# Patient Record
Sex: Male | Born: 1950 | Race: White | Hispanic: No | Marital: Married | Smoking: Former smoker
Health system: Southern US, Community
[De-identification: ages and names within clinical notes are randomized; demographics above are authoritative.]

## PROBLEM LIST (undated history)

## (undated) HISTORY — PX: SHOULDER SURGERY: SHX246

---

## 2009-04-23 ENCOUNTER — Emergency Department: Payer: Self-pay | Admitting: Emergency Medicine

## 2010-05-14 IMAGING — CR DG SHOULDER 3+V*L*
1 series · 3 of 3 positions shown · non-contrast
Comparison: none

REASON FOR EXAM: fall, shoulder pain
COMMENTS:

PROCEDURE:     DXR - DXR SHOULDER LEFT COMPLETE  - April 23, 2009 [DATE]
RESULT:     Three views of the left shoulder reveal the bones to be
reasonably well mineralized. I do not see evidence of acute fracture nor
dislocation. The AC joint is grossly intact.

[Series 1: view not recorded · 0.17mm/px · 3 of 3 slices shown]
[im 1/3]
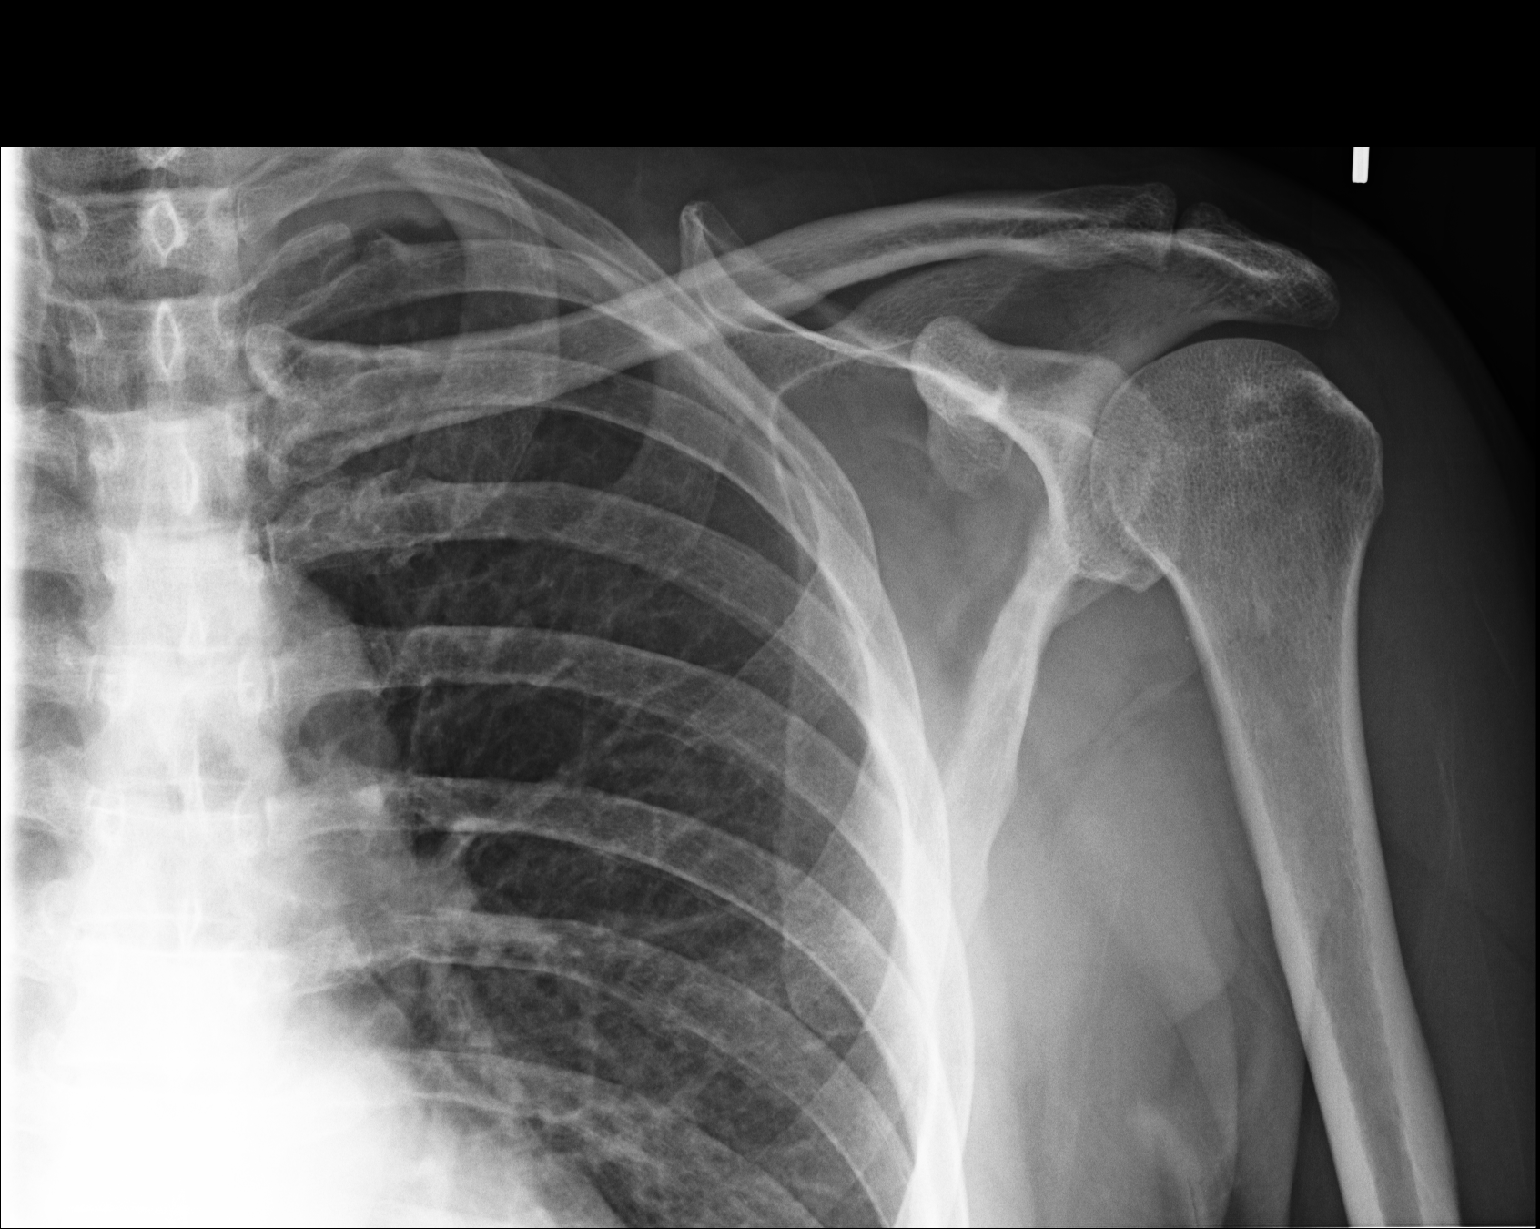
[im 2/3]
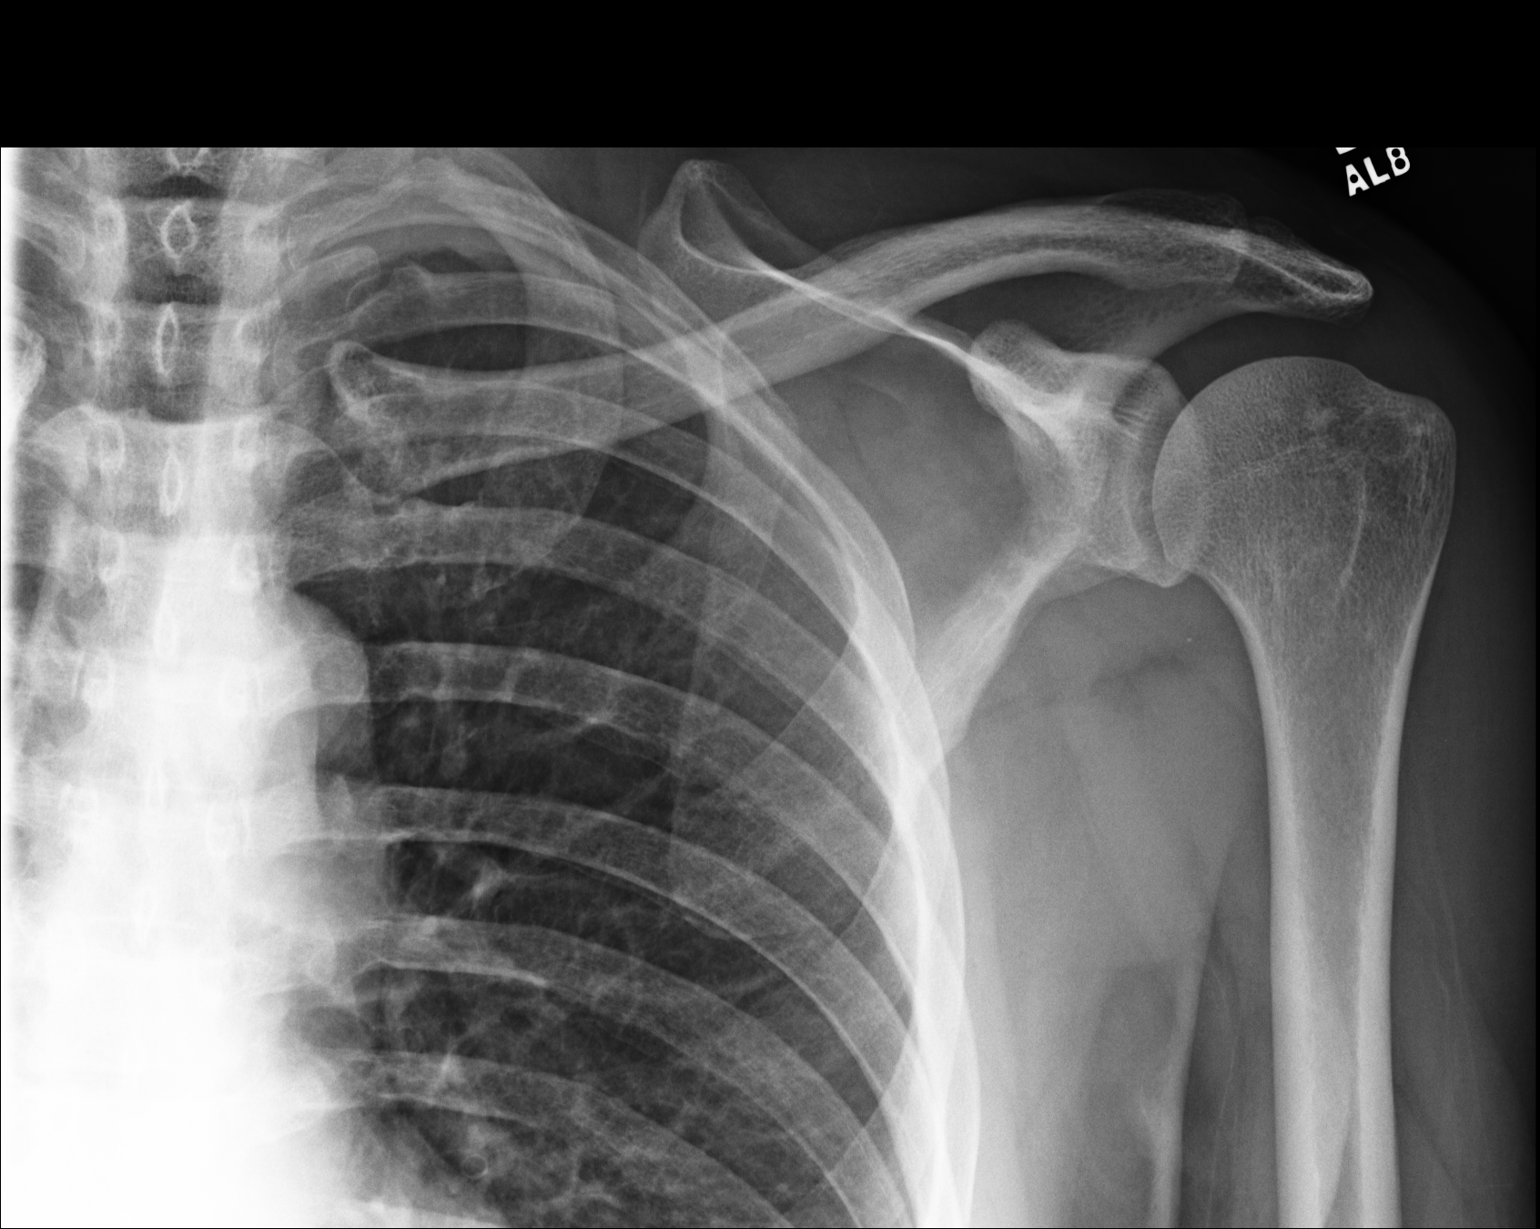
[im 3/3]
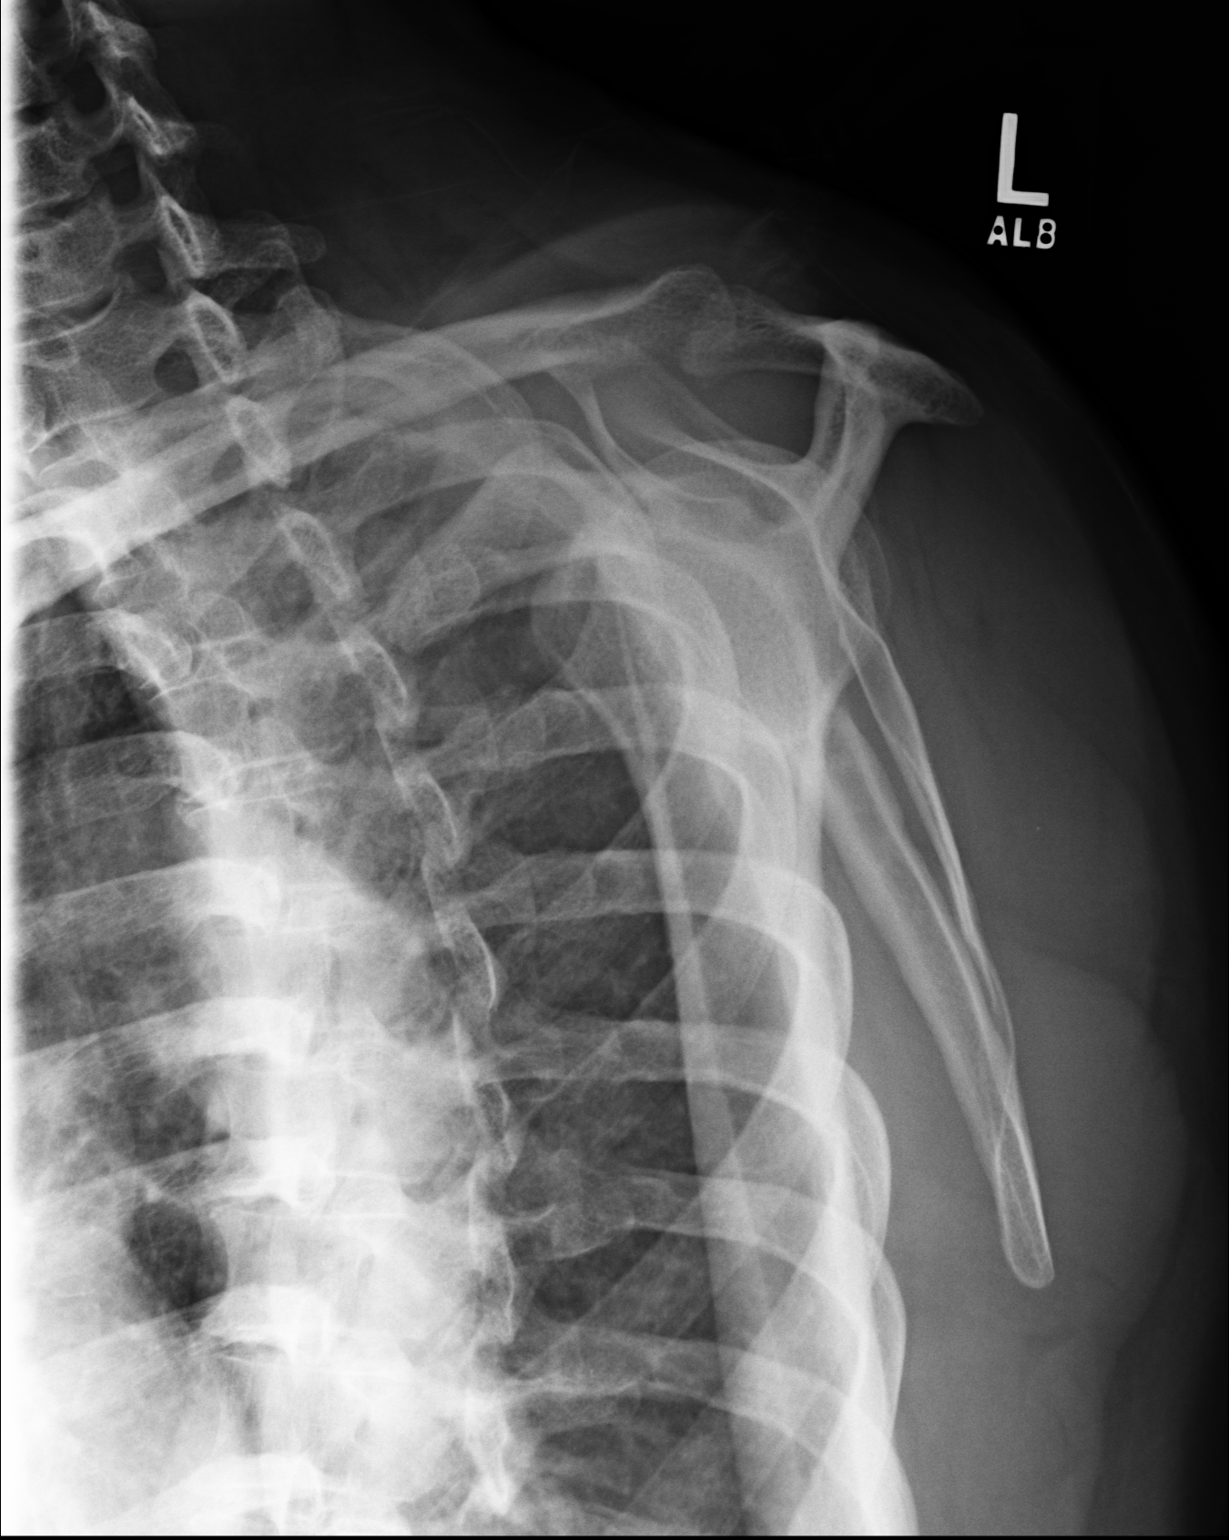

[3 of 3 positions shown; findings below may reference images not displayed]

IMPRESSION: I see no acute bony abnormality of the left shoulder.

## 2011-10-12 DIAGNOSIS — I10 Essential (primary) hypertension: Secondary | ICD-10-CM | POA: Insufficient documentation

## 2011-10-12 DIAGNOSIS — K219 Gastro-esophageal reflux disease without esophagitis: Secondary | ICD-10-CM | POA: Insufficient documentation

## 2015-05-12 DIAGNOSIS — G8929 Other chronic pain: Secondary | ICD-10-CM | POA: Insufficient documentation

## 2015-05-12 DIAGNOSIS — M19011 Primary osteoarthritis, right shoulder: Secondary | ICD-10-CM | POA: Insufficient documentation

## 2015-05-12 DIAGNOSIS — M5030 Other cervical disc degeneration, unspecified cervical region: Secondary | ICD-10-CM | POA: Insufficient documentation

## 2018-04-27 DIAGNOSIS — G5600 Carpal tunnel syndrome, unspecified upper limb: Secondary | ICD-10-CM | POA: Insufficient documentation

## 2020-07-27 NOTE — Progress Notes (Signed)
[facility] [address] [place], [place] [address]  Pulmonary Sleep Medicine   Office Visit Note  Patient Name: Travis Frank DOB: 01-31-1951 MRN [id number]    Chief Complaint: Obstructive Sleep Apnea visit  Brief History:  Travis Frank is seen today for follow up  The patient has a 5 year history of sleep apnea. He was to have a replacement of his recalled ASV. Patient stopped using his CPAP in December because his machine was putting out black particles.  Prior to this he was using PAP nightly.  The patient felt more rested after sleeping with PAP.  The patient reports benefiting from PAP use before his machine began breaking down.  His sleep is more disturbed without the CPAP  the Epworth Sleepiness Score is 5 out of 24.  The old compliance download shows excellent compliance with an average use time of 6 hours. The AHI is 2.8  The patient does not complain of limb movements disrupting sleep.  ROS  General: (-) fever, (-) chills, (-) night sweat Nose and Sinuses: (-) nasal stuffiness or itchiness, (-) postnasal drip, (-) nosebleeds, (-) sinus trouble. Mouth and Throat: (-) sore throat, (-) hoarseness. Neck: (-) swollen glands, (-) enlarged thyroid, (-) neck pain. Respiratory: - cough, - shortness of breath, - wheezing. Neurologic: - numbness, - tingling. Psychiatric: - anxiety, - depression   Current Medication: Outpatient Encounter Medications as of 07/30/2020  Medication Sig  . enalapril (VASOTEC) 10 MG tablet 5 mg.  . pantoprazole (PROTONIX) 40 MG tablet pantoprazole 40 mg tablet,delayed release  . aspirin EC 81 MG tablet Take by mouth.   No facility-administered encounter medications on file as of 07/30/2020.    Surgical History: Past Surgical History:  Procedure Laterality Date  . SHOULDER SURGERY      Medical History: History reviewed. No pertinent past medical history.  Family History: Non contributory to the present illness  Social  History: Social History   Socioeconomic History  . Marital status: Married    Spouse name: Not on file  . Number of children: Not on file  . Years of education: Not on file  . Highest education level: Not on file  Occupational History  . Not on file  Tobacco Use  . Smoking status: Former Games developer  . Smokeless tobacco: Never Used  Substance and Sexual Activity  . Alcohol use: Not on file  . Drug use: Not on file  . Sexual activity: Not on file  Other Topics Concern  . Not on file  Social History Narrative  . Not on file   Social Determinants of Health   Financial Resource Strain: Not on file  Food Insecurity: Not on file  Transportation Needs: Not on file  Physical Activity: Not on file  Stress: Not on file  Social Connections: Not on file  Intimate Partner Violence: Not on file    Vital Signs: Blood pressure 123/69, pulse 69, temperature 98.6 F (37 C), temperature source Temporal, resp. rate 16, height 5\' 7"  (1.702 m), weight 178 lb (80.7 kg), SpO2 97 %.  Examination: General Appearance: The patient is well-developed, well-nourished, and in no distress. Neck Circumference: 40 Skin: Gross inspection of skin unremarkable. Head: normocephalic, no gross deformities. Eyes: no gross deformities noted. ENT: ears appear grossly normal Neurologic: Alert and oriented. No involuntary movements.    EPWORTH SLEEPINESS SCALE:  Scale:  (0)= no chance of dozing; (1)= slight chance of dozing; (2)= moderate chance of dozing; (3)= high chance of dozing  Chance  Situtation    Sitting and reading: 1    Watching TV: 1    Sitting Inactive in public: 0    As a passenger in car: 1      Lying down to rest: 1    Sitting and talking: 0    Sitting quielty after lunch: 1    In a car, stopped in traffic: 0   TOTAL SCORE:   5 out of 24    SLEEP STUDIES:  1. PSG 11/30/15 - AHI 64.9, Low SpO2 58%   CPAP COMPLIANCE DATA:  Patient no current usage on recalled ASV.  Date  Range: 01/15/20 - 04/13/20  Average Daily Use: 6:01 hours  Median Use: 6:01 hours  Compliance for > 4 Hours: 100%  AHI: 2.8 respiratory events per hour  Days Used: 90/90 days  Mask Leak: large leak 5.1% nightly         LABS: No results found for this or any previous visit (from the past 2160 hour(s)).  Radiology: CT CERVICAL SPINE W CONTRAST  [facility] HX)  Result Date: 04/23/2009 * PRIOR REPORT IMPORTED FROM AN EXTERNAL SYSTEM * PRIOR REPORT IMPORTED FROM THE SYNGO WORKFLOW SYSTEM REASON FOR EXAM:    fall, upper neck pain COMMENTS: PROCEDURE:     CT  - CT CERVICAL SPINE WO  - Apr 23 2009 12:05PM RESULT:     Multislice helical acquisition through the cervical spine is reconstructed at bone window settings in the axial, coronal and sagittal planes. There is no previous exam for comparison. The craniocervical and atlantoaxial alignment appear to be maintained. The odontoid is intact. The prevertebral soft tissues appear normal. Degenerative disc space narrowing and hypertrophic spurring is present at C5-C6 and C6-C7 and to a lesser extent C4-C5. There is no subluxation. IMPRESSION:      Degenerative changes. No acute bony abnormality demonstrated.    CT MAXILLOFACIAL WO CONTRAST ([facility] HX)  Result Date: 04/23/2009 * PRIOR REPORT IMPORTED FROM AN EXTERNAL SYSTEM * PRIOR REPORT IMPORTED FROM THE SYNGO WORKFLOW SYSTEM REASON FOR EXAM:    fall, left sided facial pain COMMENTS: PROCEDURE:     CT  - CT MAXILLOFACIAL AREA WO  - Apr 23 2009 12:04PM RESULT:     Multislice helical acquisition through the maxillofacial structures is reconstructed in the axial and coronal planes at 3 mm slice thickness at bone window settings. There is no previous exam for comparison. The nasal septum shows a curvature to the left without a definite fracture. A nondisplaced left nasal bone fracture cannot be completely excluded but this could represent a prominent suture. The mandible appears intact. The condyles are  located in the temporomandibular fossa. The zygomatic arches, pterygoid plates and visualized portions of the temporal bone appear intact. The orbits showed no fracture of the wall. The sinuses show grossly normal aeration. IMPRESSION: 1. The possibility of a nondisplaced left nasal bone fractures not excluded. Otherwise, the study is unremarkable.    DG Shoulder Left  Result Date: 04/23/2009 * PRIOR REPORT IMPORTED FROM AN EXTERNAL SYSTEM * PRIOR REPORT IMPORTED FROM THE SYNGO WORKFLOW SYSTEM REASON FOR EXAM:    fall, shoulder pain COMMENTS: PROCEDURE:     DXR - DXR SHOULDER LEFT COMPLETE  - Apr 23 2009 12:40PM RESULT:     Three views of the left shoulder reveal the bones to be reasonably well mineralized. I do not see evidence of acute fracture nor dislocation. The [facility] joint is grossly intact. IMPRESSION:      I see  no acute bony abnormality of the left shoulder.    CT HEAD LIMITED W/O CM  Result Date: 04/23/2009 * PRIOR REPORT IMPORTED FROM AN EXTERNAL SYSTEM * PRIOR REPORT IMPORTED FROM THE SYNGO WORKFLOW SYSTEM REASON FOR EXAM:    fall, head injury, +LOC COMMENTS: PROCEDURE:     CT  - CT HEAD WITHOUT CONTRAST  - Apr 23 2009 12:04PM RESULT:     Noncontrast emergent CT of the brain is performed. The patient has no previous examination for comparison. The ventricles and sulci are normal. There is no hemorrhage. There is no focal mass, mass-effect or midline shift. There is no evidence of edema or territorial infarct. The bone windows demonstrate normal aeration of the paranasal sinuses and mastoid air cells. There is no skull fracture demonstrated. IMPRESSION: 1. No acute intracranial abnormality.     No results found.  No results found.    Assessment and Plan: Patient Active Problem List   Diagnosis Date Noted  . OSA on CPAP 07/30/2020  . CPAP use counseling 07/30/2020  . Essential hypertension 10/12/2011  . Esophageal reflux 10/12/2011    1. OSA on CPAP The patient does tolerate PAP  and reports significant benefit from PAP use. He has been unable  To use it because of black particles emitted.  The compliance has been excellent and the apnea is controlled OSA- continue excellent compliance    2. CPAP use counseling CPAP Counseling: had a lengthy discussion with the patient regarding the importance of PAP therapy in management of the sleep apnea. Patient appears to understand the risk factor reduction and also understands the risks associated with untreated sleep apnea. Patient will try to make a good faith effort to remain compliant with therapy. Also instructed the patient on proper cleaning of the device including the water must be changed daily if possible and use of distilled water is preferred. Patient understands that the machine should be regularly cleaned with appropriate recommended cleaning solutions that do not damage the PAP machine for example given white vinegar and water rinses. Other methods such as ozone treatment may not be as good as these simple methods to achieve cleaning.  3. Gastroesophageal reflux disease without esophagitis Asymptomatic on pantoprazole, continue.   General Counseling: I have discussed the findings of the evaluation and examination with Jeannett Senior.  I have also discussed any further diagnostic evaluation thatmay be needed or ordered today. Eli verbalizes understanding of the findings of todays visit. We also reviewed his medications today and discussed drug interactions and side effects including but not limited excessive drowsiness and altered mental states. We also discussed that there is always a risk not just to him but also people around him. he has been encouraged to call the office with any questions or concerns that should arise related to todays visit.  No orders of the defined types were placed in this encounter.       I have personally obtained a history, examined the patient, evaluated laboratory and imaging results,  formulated the assessment and plan and placed orders.  This patient was seen today by Emmaline Kluver, PA-C in collaboration with Dr. Freda Munro.   Valentino Hue Sol Blazing, PhD, FAASM  Diplomate, American Board of Sleep Medicine    Yevonne Pax, MD [facility] Diplomate ABMS Pulmonary and Critical Care Medicine Sleep medicine

## 2020-07-30 ENCOUNTER — Ambulatory Visit (INDEPENDENT_AMBULATORY_CARE_PROVIDER_SITE_OTHER): Payer: Medicare Other | Admitting: Internal Medicine

## 2020-07-30 VITALS — BP 123/69 | HR 69 | Temp 98.6°F | Resp 16 | Ht 67.0 in | Wt 178.0 lb

## 2020-07-30 DIAGNOSIS — Z7189 Other specified counseling: Secondary | ICD-10-CM | POA: Diagnosis not present

## 2020-07-30 DIAGNOSIS — K219 Gastro-esophageal reflux disease without esophagitis: Secondary | ICD-10-CM

## 2020-07-30 DIAGNOSIS — G4733 Obstructive sleep apnea (adult) (pediatric): Secondary | ICD-10-CM

## 2020-07-30 DIAGNOSIS — Z9989 Dependence on other enabling machines and devices: Secondary | ICD-10-CM | POA: Diagnosis not present

## 2020-07-30 DIAGNOSIS — G4731 Primary central sleep apnea: Secondary | ICD-10-CM | POA: Insufficient documentation

## 2020-07-30 NOTE — Patient Instructions (Signed)

## 2020-12-24 DIAGNOSIS — E782 Mixed hyperlipidemia: Secondary | ICD-10-CM | POA: Insufficient documentation

## 2021-04-08 ENCOUNTER — Ambulatory Visit (INDEPENDENT_AMBULATORY_CARE_PROVIDER_SITE_OTHER): Payer: Medicare Other | Admitting: Internal Medicine

## 2021-04-08 VITALS — BP 150/75 | HR 75 | Ht 67.0 in | Wt 177.0 lb

## 2021-04-08 DIAGNOSIS — G4733 Obstructive sleep apnea (adult) (pediatric): Secondary | ICD-10-CM | POA: Diagnosis not present

## 2021-04-08 DIAGNOSIS — I10 Essential (primary) hypertension: Secondary | ICD-10-CM | POA: Diagnosis not present

## 2021-04-08 NOTE — Patient Instructions (Signed)

## 2021-04-08 NOTE — Progress Notes (Signed)
[facility] [address] [place], [place] [address]  Pulmonary Sleep Medicine   Office Visit Note  Patient Name: Travis Frank DOB: [birth year] MRN [id number]    Chief Complaint: Obstructive Sleep Apnea visit  Brief History:  Travis Frank is seen today for post setup follow up. The patient has a 6 year history of sleep apnea. Patient is using PAP nightly.  The patient feels BETTER after sleeping with PAP.  The patient reports BENEFITING from PAP use.  Epworth Sleepiness Score is 9 out of 24. The patient does take 2 hr  naps on weekend on cpaP. The patient complains of the following: NOTHING  The compliance download shows  compliance with an average use time of 5:47 hours @ 90%. The AHI is 0.7  The patient does not complain of limb movements disrupting sleep.  ROS  General: (-) fever, (-) chills, (-) night sweat Nose and Sinuses: (-) nasal stuffiness or itchiness, (-) postnasal drip, (-) nosebleeds, (-) sinus trouble. Mouth and Throat: (-) sore throat, (-) hoarseness. Neck: (-) swollen glands, (-) enlarged thyroid, (-) neck pain. Respiratory: - cough, - shortness of breath, - wheezing. Neurologic: - numbness, - tingling. Psychiatric: - anxiety, - depression   Current Medication: Outpatient Encounter Medications as of 04/08/2021  Medication Sig   enalapril (VASOTEC) 5 MG tablet Take by mouth.   pantoprazole (PROTONIX) 40 MG tablet Take by mouth.   aspirin EC 81 MG tablet Take by mouth.   enalapril (VASOTEC) 10 MG tablet 5 mg.   Multiple Vitamin (MULTIVITAMIN) capsule Take 1 capsule by mouth daily.   pantoprazole (PROTONIX) 40 MG tablet pantoprazole 40 mg tablet,delayed release   No facility-administered encounter medications on file as of 04/08/2021.    Surgical History: Past Surgical History:  Procedure Laterality Date   SHOULDER SURGERY      Medical History: History reviewed. No pertinent past medical history.  Family History: Non contributory to the  present illness  Social History: Social History   Socioeconomic History   Marital status: Married    Spouse name: Not on file   Number of children: Not on file   Years of education: Not on file   Highest education level: Not on file  Occupational History   Not on file  Tobacco Use   Smoking status: Former   Smokeless tobacco: Never  Substance and Sexual Activity   Alcohol use: Not on file   Drug use: Not on file   Sexual activity: Not on file  Other Topics Concern   Not on file  Social History Narrative   Not on file   Social Determinants of Health   Financial Resource Strain: Not on file  Food Insecurity: Not on file  Transportation Needs: Not on file  Physical Activity: Not on file  Stress: Not on file  Social Connections: Not on file  Intimate Partner Violence: Not on file    Vital Signs: Blood pressure (!) 150/75, pulse 75, height 5\' 7"  (1.702 m), weight 177 lb (80.3 kg), SpO2 95 %. Body mass index is 27.72 kg/m.    Examination: General Appearance: The patient is well-developed, well-nourished, and in no distress. Neck Circumference: 41 cm Skin: Gross inspection of skin unremarkable. Head: normocephalic, no gross deformities. Eyes: no gross deformities noted. ENT: ears appear grossly normal Neurologic: Alert and oriented. No involuntary movements.    EPWORTH SLEEPINESS SCALE:  Scale:  (0)= no chance of dozing; (1)= slight chance of dozing; (2)= moderate chance of dozing; (3)= high chance of  dozing  Chance  Situtation    Sitting and reading: 1    Watching TV: 1    Sitting Inactive in public: 1    As a passenger in car: 1      Lying down to rest: 2    Sitting and talking: 1    Sitting quielty after lunch: 2    In a car, stopped in traffic: 0   TOTAL SCORE:   9 out of 24    SLEEP STUDIES:  PSG 11/30/15 - AHI 64.9, Low SpO2 58%   CPAP COMPLIANCE DATA:  Date Range: 03/09/21 - 04/18/21  Average Daily Use: 5:47 hours  Median Use:  5:53 hours  Compliance for > 4 Hours: 90%  AHI: 0.7 respiratory events per hour  Days Used: 27/30  Mask Leak: 10.0 lpm  95th Percentile Pressure: ASV @ min/max EPAP-5/15,  min/max PS 3/8 cmH2O         LABS: No results found for this or any previous visit (from the past 2160 hour(s)).  Radiology: CT CERVICAL SPINE W CONTRAST  [facility] HX)  Result Date: 04/23/2009 * PRIOR REPORT IMPORTED FROM AN EXTERNAL SYSTEM * PRIOR REPORT IMPORTED FROM THE SYNGO WORKFLOW SYSTEM REASON FOR EXAM:    fall, upper neck pain COMMENTS: PROCEDURE:     CT  - CT CERVICAL SPINE WO  - Apr 23 2009 12:05PM RESULT:     Multislice helical acquisition through the cervical spine is reconstructed at bone window settings in the axial, coronal and sagittal planes. There is no previous exam for comparison. The craniocervical and atlantoaxial alignment appear to be maintained. The odontoid is intact. The prevertebral soft tissues appear normal. Degenerative disc space narrowing and hypertrophic spurring is present at C5-C6 and C6-C7 and to a lesser extent C4-C5. There is no subluxation. IMPRESSION:      Degenerative changes. No acute bony abnormality demonstrated.    CT MAXILLOFACIAL WO CONTRAST ([facility] HX)  Result Date: 04/23/2009 * PRIOR REPORT IMPORTED FROM AN EXTERNAL SYSTEM * PRIOR REPORT IMPORTED FROM THE SYNGO WORKFLOW SYSTEM REASON FOR EXAM:    fall, left sided facial pain COMMENTS: PROCEDURE:     CT  - CT MAXILLOFACIAL AREA WO  - Apr 23 2009 12:04PM RESULT:     Multislice helical acquisition through the maxillofacial structures is reconstructed in the axial and coronal planes at 3 mm slice thickness at bone window settings. There is no previous exam for comparison. The nasal septum shows a curvature to the left without a definite fracture. A nondisplaced left nasal bone fracture cannot be completely excluded but this could represent a prominent suture. The mandible appears intact. The condyles are located in the  temporomandibular fossa. The zygomatic arches, pterygoid plates and visualized portions of the temporal bone appear intact. The orbits showed no fracture of the wall. The sinuses show grossly normal aeration. IMPRESSION: 1. The possibility of a nondisplaced left nasal bone fractures not excluded. Otherwise, the study is unremarkable.    DG Shoulder Left  Result Date: 04/23/2009 * PRIOR REPORT IMPORTED FROM AN EXTERNAL SYSTEM * PRIOR REPORT IMPORTED FROM THE SYNGO WORKFLOW SYSTEM REASON FOR EXAM:    fall, shoulder pain COMMENTS: PROCEDURE:     DXR - DXR SHOULDER LEFT COMPLETE  - Apr 23 2009 12:40PM RESULT:     Three views of the left shoulder reveal the bones to be reasonably well mineralized. I do not see evidence of acute fracture nor dislocation. The [facility] joint is grossly intact. IMPRESSION:  I see no acute bony abnormality of the left shoulder.    CT HEAD LIMITED W/O CM  Result Date: 04/23/2009 * PRIOR REPORT IMPORTED FROM AN EXTERNAL SYSTEM * PRIOR REPORT IMPORTED FROM THE SYNGO WORKFLOW SYSTEM REASON FOR EXAM:    fall, head injury, +LOC COMMENTS: PROCEDURE:     CT  - CT HEAD WITHOUT CONTRAST  - Apr 23 2009 12:04PM RESULT:     Noncontrast emergent CT of the brain is performed. The patient has no previous examination for comparison. The ventricles and sulci are normal. There is no hemorrhage. There is no focal mass, mass-effect or midline shift. There is no evidence of edema or territorial infarct. The bone windows demonstrate normal aeration of the paranasal sinuses and mastoid air cells. There is no skull fracture demonstrated. IMPRESSION: 1. No acute intracranial abnormality.     No results found.  No results found.    Assessment and Plan: Patient Active Problem List   Diagnosis Date Noted   OSA on CPAP 07/30/2020   CPAP use counseling 07/30/2020   Essential hypertension 10/12/2011   Esophageal reflux 10/12/2011   1. OSA treated with BiPAP The patient does tolerate PAP and reports   benefit from PAP use. The patient was reminded how to clean equipment and advised to replace supplies routinely. The patient was also counselled on weight loss. He told me he wanted to have the [facility] surgical procedure. We did discuss his central apneas and I did caution him the Earnest Bailey would not address that.. The compliance is very good. The AHI is 0.7.   OSA treated with bipap. Well controlled. Continue with very good compliance with bipap.   2. Essential hypertension Hypertension Counseling:   The following hypertensive lifestyle modification were recommended and discussed:  1. Limiting alcohol intake to less than 1 oz/day of ethanol:(24 oz of beer or 8 oz of wine or 2 oz of 100-proof whiskey). 2. Take baby ASA 81 mg daily. 3. Importance of regular aerobic exercise and losing weight. 4. Reduce dietary saturated fat and cholesterol intake for overall cardiovascular health. 5. Maintaining adequate dietary potassium, calcium, and magnesium intake. 6. Regular monitoring of the blood pressure. 7. Reduce sodium intake to less than 100 mmol/day (less than 2.3 gm of sodium or less than 6 gm of sodium choride)       General Counseling: I have discussed the findings of the evaluation and examination with Travis Frank.  I have also discussed any further diagnostic evaluation thatmay be needed or ordered today. Travis Frank verbalizes understanding of the findings of todays visit. We also reviewed his medications today and discussed drug interactions and side effects including but not limited excessive drowsiness and altered mental states. We also discussed that there is always a risk not just to him but also people around him. he has been encouraged to call the office with any questions or concerns that should arise related to todays visit.  No orders of the defined types were placed in this encounter.       I have personally obtained a history, examined the patient, evaluated laboratory and imaging  results, formulated the assessment and plan and placed orders. This patient was seen today by Emmaline Kluver, PA-C in collaboration with Dr. Freda Munro.   Yevonne Pax, MD [facility] Diplomate ABMS Pulmonary Critical Care Medicine and Sleep Medicine

## 2022-02-06 ENCOUNTER — Other Ambulatory Visit: Payer: Self-pay | Admitting: Surgery

## 2022-04-07 ENCOUNTER — Ambulatory Visit (INDEPENDENT_AMBULATORY_CARE_PROVIDER_SITE_OTHER): Payer: Medicare Other | Admitting: Internal Medicine

## 2022-04-07 VITALS — BP 130/76 | HR 83 | Resp 16 | Ht 67.0 in | Wt 180.0 lb

## 2022-04-07 DIAGNOSIS — G4731 Primary central sleep apnea: Secondary | ICD-10-CM

## 2022-04-07 DIAGNOSIS — I10 Essential (primary) hypertension: Secondary | ICD-10-CM | POA: Diagnosis not present

## 2022-04-07 DIAGNOSIS — Z7189 Other specified counseling: Secondary | ICD-10-CM

## 2022-04-07 NOTE — Patient Instructions (Signed)

## 2022-04-07 NOTE — Progress Notes (Signed)
[facility] [address] [place], [place] [address]  Pulmonary Sleep Medicine   Office Visit Note  Patient Name: Travis Frank DOB: 10/27/1950 MRN [id number]    Chief Complaint: Obstructive Sleep Apnea visit  Brief History:  Travis Frank is seen today for an annual follow up on ASV. The patient has a 6 year history of sleep apnea. Patient is using PAP nightly.  The patient feels rested after sleeping with PAP.  The patient reports benefiting from PAP use. Reported sleepiness is  improved and the Epworth Sleepiness Score is 3 out of 24. The patient does take naps about once a week for about 30 minutes. The patient complains of the following: Mask leak, his current mask has been the best fit.  The compliance download shows 99% compliance with an average use time of 5 hours 57 minutes. The AHI is 1.3.  The patient does not complain of limb movements disrupting sleep.  ROS  General: (-) fever, (-) chills, (-) night sweat Nose and Sinuses: (-) nasal stuffiness or itchiness, (-) postnasal drip, (-) nosebleeds, (-) sinus trouble. Mouth and Throat: (-) sore throat, (-) hoarseness. Neck: (-) swollen glands, (-) enlarged thyroid, (-) neck pain. Respiratory: - cough, - shortness of breath, - wheezing. Neurologic: - numbness, - tingling. Psychiatric: - anxiety, - depression   Current Medication: Outpatient Encounter Medications as of 04/07/2022  Medication Sig   aspirin EC 81 MG tablet Take by mouth.   enalapril (VASOTEC) 5 MG tablet Take by mouth.   Multiple Vitamin (MULTIVITAMIN) capsule Take 1 capsule by mouth daily.   pantoprazole (PROTONIX) 40 MG tablet Take by mouth.   [DISCONTINUED] enalapril (VASOTEC) 10 MG tablet 5 mg.   [DISCONTINUED] pantoprazole (PROTONIX) 40 MG tablet pantoprazole 40 mg tablet,delayed release   No facility-administered encounter medications on file as of 04/07/2022.    Surgical History: Past Surgical History:  Procedure Laterality Date    SHOULDER SURGERY      Medical History: History reviewed. No pertinent past medical history.  Family History: Non contributory to the present illness  Social History: Social History   Socioeconomic History   Marital status: Married    Spouse name: Not on file   Number of children: Not on file   Years of education: Not on file   Highest education level: Not on file  Occupational History   Not on file  Tobacco Use   Smoking status: Former   Smokeless tobacco: Never  Substance and Sexual Activity   Alcohol use: Not on file   Drug use: Not on file   Sexual activity: Not on file  Other Topics Concern   Not on file  Social History Narrative   Not on file   Social Determinants of Health   Financial Resource Strain: Not on file  Food Insecurity: Not on file  Transportation Needs: Not on file  Physical Activity: Not on file  Stress: Not on file  Social Connections: Not on file  Intimate Partner Violence: Not on file    Vital Signs: Blood pressure 130/76, pulse 83, resp. rate 16, height 5\' 7"  (1.702 m), weight 180 lb (81.6 kg), SpO2 98 %. Body mass index is 28.19 kg/m.    Examination: General Appearance: The patient is well-developed, well-nourished, and in no distress. Neck Circumference: 42 cm Skin: Gross inspection of skin unremarkable. Head: normocephalic, no gross deformities. Eyes: no gross deformities noted. ENT: ears appear grossly normal Neurologic: Alert and oriented. No involuntary movements.  STOP BANG RISK ASSESSMENT S (  snore) Have you been told that you snore?     NO   T (tired) Are you often tired, fatigued, or sleepy during the day?   NO  O (obstruction) Do you stop breathing, choke, or gasp during sleep? NO   P (pressure) Do you have or are you being treated for high blood pressure? YES   B (BMI) Is your body index greater than 35 kg/m? NO   A (age) Are you 34 years old or older? YES   N (neck) Do you have a neck circumference greater than  16 inches?   YES   G (gender) Are you a male? YES   TOTAL STOP/BANG "YES" ANSWERS 4       A STOP-Bang score of 2 or less is considered low risk, and a score of 5 or more is high risk for having either moderate or severe OSA. For people who score 3 or 4, doctors may need to perform further assessment to determine how likely they are to have OSA.         EPWORTH SLEEPINESS SCALE:  Scale:  (0)= no chance of dozing; (1)= slight chance of dozing; (2)= moderate chance of dozing; (3)= high chance of dozing  Chance  Situtation    Sitting and reading: 1    Watching TV: 0    Sitting Inactive in public: 1    As a passenger in car: 0      Lying down to rest: 1    Sitting and talking: 0    Sitting quielty after lunch: 0    In a car, stopped in traffic: 0   TOTAL SCORE:   3 out of 24    SLEEP STUDIES:  PSG (12/10/15)  AHI 64.9, min SPO2 58%   CPAP COMPLIANCE DATA:  Date Range: 11/18/21 - 04/06/22  Average Daily Use: 5 hours 57 minutes  Median Use: 5 hours 39 minutes  Compliance for > 4 Hours: 138 days  AHI: 1.3 respiratory events per hour  Days Used: 140/140  Mask Leak: 23.9  95th Percentile Pressure: asv         LABS: No results found for this or any previous visit (from the past 2160 hour(s)).  Radiology: DG Shoulder Left  Result Date: 04/23/2009 * PRIOR REPORT IMPORTED FROM AN EXTERNAL SYSTEM * PRIOR REPORT IMPORTED FROM THE [employer] WORKFLOW SYSTEM REASON FOR EXAM:    fall, shoulder pain COMMENTS: PROCEDURE:     DXR - DXR SHOULDER LEFT COMPLETE  - Apr 23 2009 12:40PM RESULT:     Three views of the left shoulder reveal the bones to be reasonably well mineralized. I do not see evidence of acute fracture nor dislocation. The [facility] joint is grossly intact. IMPRESSION:      I see no acute bony abnormality of the left shoulder.    CT CERVICAL SPINE W CONTRAST  ([facility] HX)  Result Date: 04/23/2009 * PRIOR REPORT IMPORTED FROM AN EXTERNAL SYSTEM * PRIOR REPORT  IMPORTED FROM THE [employer] WORKFLOW SYSTEM REASON FOR EXAM:    fall, upper neck pain COMMENTS: PROCEDURE:     CT  - CT CERVICAL SPINE WO  - Apr 23 2009 12:05PM RESULT:     Multislice helical acquisition through the cervical spine is reconstructed at bone window settings in the axial, coronal and sagittal planes. There is no previous exam for comparison. The craniocervical and atlantoaxial alignment appear to be maintained. The odontoid is intact. The prevertebral soft tissues appear normal. Degenerative disc space  narrowing and hypertrophic spurring is present at C5-C6 and C6-C7 and to a lesser extent C4-C5. There is no subluxation. IMPRESSION:      Degenerative changes. No acute bony abnormality demonstrated.    CT MAXILLOFACIAL WO CONTRAST ([facility] HX)  Result Date: 04/23/2009 * PRIOR REPORT IMPORTED FROM AN EXTERNAL SYSTEM * PRIOR REPORT IMPORTED FROM THE [employer]GO WORKFLOW SYSTEM REASON FOR EXAM:    fall, left sided facial pain COMMENTS: PROCEDURE:     CT  - CT MAXILLOFACIAL AREA WO  - Apr 23 2009 12:04PM RESULT:     Multislice helical acquisition through the maxillofacial structures is reconstructed in the axial and coronal planes at 3 mm slice thickness at bone window settings. There is no previous exam for comparison. The nasal septum shows a curvature to the left without a definite fracture. A nondisplaced left nasal bone fracture cannot be completely excluded but this could represent a prominent suture. The mandible appears intact. The condyles are located in the temporomandibular fossa. The zygomatic arches, pterygoid plates and visualized portions of the temporal bone appear intact. The orbits showed no fracture of the wall. The sinuses show grossly normal aeration. IMPRESSION: 1. The possibility of a nondisplaced left nasal bone fractures not excluded. Otherwise, the study is unremarkable.    CT HEAD LIMITED W/O CM  Result Date: 04/23/2009 * PRIOR REPORT IMPORTED FROM AN EXTERNAL SYSTEM * PRIOR REPORT  IMPORTED FROM THE [employer] WORKFLOW SYSTEM REASON FOR EXAM:    fall, head injury, +LOC COMMENTS: PROCEDURE:     CT  - CT HEAD WITHOUT CONTRAST  - Apr 23 2009 12:04PM RESULT:     Noncontrast emergent CT of the brain is performed. The patient has no previous examination for comparison. The ventricles and sulci are normal. There is no hemorrhage. There is no focal mass, mass-effect or midline shift. There is no evidence of edema or territorial infarct. The bone windows demonstrate normal aeration of the paranasal sinuses and mastoid air cells. There is no skull fracture demonstrated. IMPRESSION: 1. No acute intracranial abnormality.     No results found.  No results found.    Assessment and Plan: Patient Active Problem List   Diagnosis Date Noted   Mixed hyperlipidemia 12/24/2020   OSA treated with BiPAP 07/30/2020   Encounter for BiPAP use counseling 07/30/2020   Carpal tunnel syndrome 04/27/2018   Chronic right shoulder pain 05/12/2015   Degenerative disc disease, cervical 05/12/2015   Primary osteoarthritis of right shoulder 05/12/2015   Essential hypertension 10/12/2011   Esophageal reflux 10/12/2011    1. Complex sleep apnea syndrome The patient does tolerate PAP and reports  benefit from PAP use. The patient was reminded how to clean equipment and advised to replace supplies routinely. The patient was also counselled on trying to sleep longer. . The compliance is excellent. The AHI is 1.3.  Continue with excellent compliance with bipap asv. F/u one year.   2. Encounter for BiPAP use counseling  Counseling: had a lengthy discussion with the patient regarding the importance of PAP therapy in management of the sleep apnea. Patient appears to understand the risk factor reduction and also understands the risks associated with untreated sleep apnea. Patient will try to make a good faith effort to remain compliant with therapy. Also instructed the patient on proper cleaning of the device  including the water must be changed daily if possible and use of distilled water is preferred. Patient understands that the machine should be regularly cleaned with appropriate recommended cleaning  solutions that do not damage the PAP machine for example given white vinegar and water rinses. Other methods such as ozone treatment may not be as good as these simple methods to achieve cleaning.   3. Essential hypertension Hypertension Counseling:   The following hypertensive lifestyle modification were recommended and discussed:  1. Limiting alcohol intake to less than 1 oz/day of ethanol:(24 oz of beer or 8 oz of wine or 2 oz of 100-proof whiskey). 2. Take baby ASA 81 mg daily. 3. Importance of regular aerobic exercise and losing weight. 4. Reduce dietary saturated fat and cholesterol intake for overall cardiovascular health. 5. Maintaining adequate dietary potassium, calcium, and magnesium intake. 6. Regular monitoring of the blood pressure. 7. Reduce sodium intake to less than 100 mmol/day (less than 2.3 gm of sodium or less than 6 gm of sodium choride)        General Counseling: I have discussed the findings of the evaluation and examination with Travis Frank.  I have also discussed any further diagnostic evaluation thatmay be needed or ordered today. Travis Frank verbalizes understanding of the findings of todays visit. We also reviewed his medications today and discussed drug interactions and side effects including but not limited excessive drowsiness and altered mental states. We also discussed that there is always a risk not just to him but also people around him. he has been encouraged to call the office with any questions or concerns that should arise related to todays visit.  No orders of the defined types were placed in this encounter.       I have personally obtained a history, examined the patient, evaluated laboratory and imaging results, formulated the assessment and plan and placed  orders. This patient was seen today by Emmaline KluverSarah Terrell, PA-C in collaboration with Dr. Freda MunroSaadat Zymiere Trostle.   Yevonne PaxSaadat A Kayonna Lawniczak, MD [facility] Diplomate ABMS Pulmonary Critical Care Medicine and Sleep Medicine

## 2023-04-03 NOTE — Progress Notes (Signed)
[facility] [address] [place], [place] [address]  Pulmonary Sleep Medicine   Office Visit Note  Patient Name: Travis Frank DOB: April 09, 1951 MRN [id number]    Chief Complaint: Obstructive Sleep Apnea visit  Brief History:  Travis Frank is seen today for an annual follow up visit for BiPAP ASV@ EPAP min 5 max 15, PS min 3 max 8 cmH2O. The patient has a 7 year history of sleep apnea. Patient is using PAP nightly.  The patient feels rested after sleeping with PAP.  The patient reports benefiting from PAP use. Reported sleepiness is  improved and the Epworth Sleepiness Score is 4 out of 24. The patient will take naps on Sundays. The patient complains of the following: none.  The compliance download shows  92% compliance with an average use time of 6 hours 29 minutes. The AHI is 1.6.  The patient does not complain of limb movements disrupting sleep. The patient continues to require PAP therapy in order to eliminate sleep apnea.  He has not seen a cardiologist this year, and has had no swelling or shortness of breath.   ROS  General: (-) fever, (-) chills, (-) night sweat Nose and Sinuses: (-) nasal stuffiness or itchiness, (-) postnasal drip, (-) nosebleeds, (-) sinus trouble. Mouth and Throat: (-) sore throat, (-) hoarseness. Neck: (-) swollen glands, (-) enlarged thyroid, (-) neck pain. Respiratory: - cough, - shortness of breath, - wheezing. Neurologic: - numbness, - tingling. Psychiatric: - anxiety, - depression   Current Medication: Outpatient Encounter Medications as of 04/06/2023  Medication Sig   aspirin EC 81 MG tablet Take by mouth.   enalapril (VASOTEC) 5 MG tablet Take by mouth.   Multiple Vitamin (MULTIVITAMIN) capsule Take 1 capsule by mouth daily.   pantoprazole (PROTONIX) 40 MG tablet Take by mouth.   No facility-administered encounter medications on file as of 04/06/2023.    Surgical History: Past Surgical History:  Procedure Laterality Date    SHOULDER SURGERY      Medical History: History reviewed. No pertinent past medical history.  Family History: Non contributory to the present illness  Social History: Social History   Socioeconomic History   Marital status: Married    Spouse name: Not on file   Number of children: Not on file   Years of education: Not on file   Highest education level: Not on file  Occupational History   Not on file  Tobacco Use   Smoking status: Former   Smokeless tobacco: Never  Substance and Sexual Activity   Alcohol use: Not on file   Drug use: Not on file   Sexual activity: Not on file  Other Topics Concern   Not on file  Social History Narrative   Not on file   Social Determinants of Health   Financial Resource Strain: Low Risk  (12/21/2020)   Received from [facility], [facility]   Overall Financial Resource Strain (CARDIA)    Difficulty of Paying Living Expenses: Not hard at all  Food Insecurity: No Food Insecurity (12/21/2020)   Received from [facility], [facility]   Hunger Vital Sign    Worried About Running Out of Food in the Last Year: Never true    Ran Out of Food in the Last Year: Never true  Transportation Needs: No Transportation Needs (12/21/2020)   Received from [facility]   PRAPARE - Transportation    Lack of Transportation (Medical): No    Lack of Transportation (Non-Medical): No  Physical Activity: Insufficiently Active (12/21/2020)   Received from [facility], [facility]   Exercise Vital Sign    Days of Exercise per Week: 7 days    Minutes of Exercise per Session: 20 min  Stress: No Stress Concern Present (12/21/2020)   Received from [facility], [facility] - Occupational Stress Questionnaire    Feeling of Stress : Not at all  Social Connections: Unknown (09/08/2021)   Received from [facility], [facility]   Social Network    Social Network: Not on file  Intimate Partner  Violence: Unknown (07/31/2021)   Received from [facility], [facility]   HITS    Physically Hurt: Not on file    Insult or Talk Down To: Not on file    Threaten Physical Harm: Not on file    Scream or Curse: Not on file    Vital Signs: Blood pressure 138/76, pulse 78, resp. rate 16, height 5\' 6"  (1.676 m), weight 180 lb (81.6 kg), SpO2 97%. Body mass index is 29.05 kg/m.    Examination: General Appearance: The patient is well-developed, well-nourished, and in no distress. Neck Circumference: 42 cm Skin: Gross inspection of skin unremarkable. Head: normocephalic, no gross deformities. Eyes: no gross deformities noted. ENT: ears appear grossly normal Neurologic: Alert and oriented. No involuntary movements.  STOP BANG RISK ASSESSMENT S (snore) Have you been told that you snore?     NO   T (tired) Are you often tired, fatigued, or sleepy during the day?   NO  O (obstruction) Do you stop breathing, choke, or gasp during sleep? NO   P (pressure) Do you have or are you being treated for high blood pressure? YES   B (BMI) Is your body index greater than 35 kg/m? NO   A (age) Are you 86 years old or older? YES   N (neck) Do you have a neck circumference greater than 16 inches?   YES   G (gender) Are you a male? YES   TOTAL STOP/BANG "YES" ANSWERS 4       A STOP-Bang score of 2 or less is considered low risk, and a score of 5 or more is high risk for having either moderate or severe OSA. For people who score 3 or 4, doctors may need to perform further assessment to determine how likely they are to have OSA.         EPWORTH SLEEPINESS SCALE:  Scale:  (0)= no chance of dozing; (1)= slight chance of dozing; (2)= moderate chance of dozing; (3)= high chance of dozing  Chance  Situtation    Sitting and reading: 1    Watching TV: 1    Sitting Inactive in public: 0    As a passenger in car: 0      Lying down to rest: 2    Sitting and talking: 0    Sitting quielty  after lunch: 0    In a car, stopped in traffic: 0   TOTAL SCORE:   4 out of 24    SLEEP STUDIES:  PSG (11/2015) AHI 65/hr, min Spo2 58% Titration (11/2009) recommend another titration Titration (11/2015) BiPAP ASV @ EPAP min 10 max 15, PS min 6 max 15, MaxPress 25 cmH2O, Backup rate 13   CPAP COMPLIANCE DATA:  Date Range: 04/07/2022-04/02/2023  Average Daily Use: 6 hours 29 minutes  Median Use: 6 hours 22 minutes  Compliance for > 4 Hours: 92%  AHI: 1.6  respiratory events per hour  Days Used: 337/361 days  Mask Leak: 21.9  95th Percentile Pressure: 13.7/8.2         LABS: No results found for this or any previous visit (from the past 2160 hour(s)).  Radiology: DG Shoulder Left  Result Date:PRIOR REPORT IMPORTED FROM AN EXTERNAL SYSTEM PRIOR REPORT IMPORTED FROM THE SYNGO WORKFLOW SYSTEM REASON FOR EXAM:    fall, shoulder pain COMMENTS: PROCEDURE:     DXR - DXR SHOULDER LEFT COMPLETE  - Apr 23 2009 12:40PM RESULT:     Three views of the left shoulder reveal the bones to be reasonably well mineralized. I do not see evidence of acute fracture nor dislocation. [facility] joint is grossly intact. IMPRESSION:      I see no acute bony abnormality of the left shoulder.    CT CERVICAL SPINE W CONTRAST  ([facility] HX)  Result Date: 04/23/2009  PRIOR REPORT IMPORTED FROM AN EXTERNAL SYSTEM PRIOR REPORT IMPORTED FROM THE SYNGO WORKFLOW SYSTEM REASON FOR EXAM:    fall, upper neck pain COMMENTS: PROCEDURE:     CT  - CT CERVICAL SPINE WO  - Apr 23 2009 12:05PM RESULT:     Multislice helical acquisition through the cervical spine is reconstructed at bone window settings in the axial, coronal and sagittal planes. There is no previous exam for comparison. The craniocervical and atlantoaxial alignment appear to be maintained. The odontoid is intact. The prevertebral soft tissues appear normal. Degenerative disc space narrowing and hypertrophic spurring is present at C5-C6 and C6-C7 and to a  lesser extent C4-C5. There is no subluxation. IMPRESSION:      Degenerative changes. No acute bony abnormality demonstrated.    CT MAXILLOFACIAL WO CONTRAST ([facility] HX)  Result Date: 04/23/2009 PRIOR REPORT IMPORTED FROM AN EXTERNAL SYSTEM  PRIOR REPORT IMPORTED FROM THE SYNGO WORKFLOW SYSTEM REASON FOR EXAM:    fall, left sided facial pain COMMENTS: PROCEDURE:     CT  - CT MAXILLOFACIAL AREA WO  - Apr 23 2009 12:04PM RESULT:     Multislice helical acquisition through the maxillofacial structures is reconstructed in the axial and coronal planes at 3 mm slice thickness at bone window settings. There is no previous exam for comparison. The nasal septum shows a curvature to the left without a definite fracture. A nondisplaced left nasal bone fracture cannot be completely excluded but this could represent a prominent suture. The mandible appears intact. The condyles are located in the temporomandibular fossa. The zygomatic arches, pterygoid plates and visualized portions of the temporal bone appear intact. The orbits showed no fracture of the wall. The sinuses show grossly normal aeration. IMPRESSION: 1. The possibility of a nondisplaced left nasal bone fractures not excluded. Otherwise, the study is unremarkable.    CT HEAD LIMITED W/O CM  Result Date: 04/23/2009 **** PRIOR REPORT IMPORTED FROM AN EXTERNAL SYSTEM **** PRIOR REPORT IMPORTED FROM THE SYNGO WORKFLOW SYSTEM REASON FOR EXAM:    fall, head injury, +LOC COMMENTS: PROCEDURE:     CT  - CT HEAD WITHOUT CONTRAST  - Apr 23 2009 12:04PM RESULT:     Noncontrast emergent CT of the brain is performed. The patient has no previous examination for comparison. The ventricles and sulci are normal. There is no hemorrhage. There is no focal mass, mass-effect or midline shift. There is no evidence of edema or territorial infarct. The bone windows demonstrate normal aeration of the paranasal sinuses and mastoid air cells. There is no skull fracture demonstrated.  IMPRESSION: 1. No acute intracranial abnormality.     No results found.  No results found.    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 Complex sleep apnea syndrome The patient does tolerate PAP and reports  benefit from PAP use. The patient was reminded how to clean equipment and advised to replace supplies routinely. The patient was also counselled on weight loss. The compliance is good. The AHI is 1.6.   OSA on cpap- controlled. Continue with excellent compliance with pap. CPAP continues to be medically necessary to treat this patient's OSA. F/u one year.    2. Encounter for BiPAP use counseling CPAP Counseling: had a lengthy discussion with the patient regarding the importance of PAP therapy in management of the sleep apnea. Patient appears to understand the risk factor reduction and also understands the risks associated with untreated sleep apnea. Patient will try to make a good faith effort to remain compliant with therapy. Also instructed the patient on proper cleaning of the device including the water must be changed daily if possible and use of distilled water is preferred. Patient understands that the machine should be regularly cleaned with appropriate recommended cleaning solutions that do not damage the PAP machine for example given white vinegar and water rinses. Other methods such as ozone treatment may not be as good as these simple methods to achieve cleaning.   3. Essential hypertension Hypertension Counseling:   The following hypertensive lifestyle modification were  recommended and discussed:  1. Limiting alcohol intake to less than 1 oz/day of ethanol:(24 oz of beer or 8 oz of wine or 2 oz of 100-proof whiskey). 2. Take baby ASA 81 mg daily. 3. Importance of regular aerobic exercise and losing weight. 4. Reduce dietary saturated fat and cholesterol intake for overall cardiovascular health. 5. Maintaining adequate dietary potassium, calcium, and magnesium intake. 6. Regular monitoring of the blood pressure. 7. Reduce sodium intake to less than 100 mmol/day (less than 2.3 gm of sodium or less than 6 gm of sodium choride)       General Counseling: I have discussed the findings of the evaluation and examination with Jeannett Senior.  I have also discussed any further diagnostic evaluation thatmay be needed or ordered today. Adeoluwa verbalizes understanding of the findings of todays visit. We also reviewed his medications today and discussed drug interactions and side effects including but not limited excessive drowsiness and altered mental states. We also discussed that there is always a risk not just to him but also people around him. he has been encouraged to call the office with any questions or concerns that should arise related to todays visit.  No orders of the defined types were placed in this encounter.       I have personally obtained a history, examined the patient, evaluated laboratory and imaging results, formulated the assessment and plan and placed orders. This patient was seen today by Emmaline Kluver, PA-C in collaboration with Dr. Freda Munro.   Yevonne Pax, MD [facility] Diplomate ABMS Pulmonary Critical Care Medicine and Sleep Medicine

## 2023-04-06 ENCOUNTER — Ambulatory Visit: Payer: Medicare Other | Admitting: Internal Medicine

## 2023-04-06 VITALS — BP 138/76 | HR 78 | Resp 16 | Ht 66.0 in | Wt 180.0 lb

## 2023-04-06 DIAGNOSIS — Z7189 Other specified counseling: Secondary | ICD-10-CM

## 2023-04-06 DIAGNOSIS — I10 Essential (primary) hypertension: Secondary | ICD-10-CM

## 2023-04-06 DIAGNOSIS — G4739 Other sleep apnea: Secondary | ICD-10-CM | POA: Diagnosis not present

## 2023-04-06 NOTE — Patient Instructions (Signed)

## 2024-04-10 NOTE — Progress Notes (Unsigned)
 [facility] [address] [place], [place] [address]  Pulmonary Sleep Medicine   Office Visit Note  Patient Name: Travis Frank DOB: August 19, 1950 MRN [id number]    Chief Complaint: Obstructive Sleep Apnea visit  Brief History:  [place] is seen today for an annual follow up visit for BiPAP ASV@ EPAP min 5 max 15, PS min 3 max 8 cmH2O, RR auto. The patient has a 8 year history of sleep apnea. Patient is using PAP nightly.  The patient feels rested after sleeping with PAP.  The patient reports benefiting from PAP use. Reported sleepiness is  improved and the Epworth Sleepiness Score is 5 out of 24. The patient does not take naps. The patient complains of the following: none.  The compliance download shows 94% compliance with an average use time of 6 hours 48 minutes. The AHI is 4.2.  The patient does not complain of limb movements disrupting sleep. The patient continues to require PAP therapy in order to eliminate sleep apnea.   ROS  General: (-) fever, (-) chills, (-) night sweat Nose and Sinuses: (-) nasal stuffiness or itchiness, (-) postnasal drip, (-) nosebleeds, (-) sinus trouble. Mouth and Throat: (-) sore throat, (-) hoarseness. Neck: (-) swollen glands, (-) enlarged thyroid, (-) neck pain. Respiratory: - cough, - shortness of breath, - wheezing. Neurologic: - numbness, - tingling. Psychiatric: - anxiety, - depression   Current Medication: Outpatient Encounter Medications as of 04/11/2024  Medication Sig   aspirin EC 81 MG tablet Take by mouth.   enalapril (VASOTEC) 5 MG tablet Take by mouth.   Multiple Vitamin (MULTIVITAMIN) capsule Take 1 capsule by mouth daily.   pantoprazole (PROTONIX) 40 MG tablet Take by mouth.   No facility-administered encounter medications on file as of 04/11/2024.    Surgical History: Past Surgical History:  Procedure Laterality Date   SHOULDER SURGERY      Medical History: No past medical history on file.  Family  History: Non contributory to the present illness  Social History: Social History   Socioeconomic History   Marital status: Married    Spouse name: Not on file   Number of children: Not on file   Years of education: Not on file   Highest education level: Not on file  Occupational History   Not on file  Tobacco Use   Smoking status: Former   Smokeless tobacco: Never  Substance and Sexual Activity   Alcohol use: Not on file   Drug use: Not on file   Sexual activity: Not on file  Other Topics Concern   Not on file  Social History Narrative   Not on file   Social Drivers of Health   Tobacco Use: Low Risk (10/05/2023)   Received [facility]   Patient History    Smoking Tobacco Use: Never    Smokeless Tobacco Use: Never    Passive Exposure: Never  Financial Resource Strain: Not on file  Food Insecurity: Not on file  Transportation Needs: Not on file  Physical Activity: Not on file  Stress: Not on file  Social Connections: Unknown (09/08/2021)   Received [facility]   Social Network    Social Network: Not on file  Intimate Partner Violence: Unknown (07/31/2021)   Received from [facility]   HITS    Physically Hurt: Not on file    Insult or Talk Down To: Not on file    Threaten Physical Harm: Not on file    Scream or Curse:  Not on file  Depression (EYV7-0): Not on file  Alcohol Screen: Not on file  Housing: Not on file  Utilities: Not on file  Health Literacy: Not on file    Vital Signs: There were no vitals taken for this visit. There is no height or weight on file to calculate BMI.    Examination: General Appearance: The patient is well-developed, well-nourished, and in no distress. Neck Circumference: 42 cm Skin: Gross inspection of skin unremarkable. Head: normocephalic, no gross deformities. Eyes: no gross deformities noted. ENT: ears appear grossly normal Neurologic: Alert and oriented. No involuntary movements.  STOP BANG RISK  ASSESSMENT S (snore) Have you been told that you snore?     NO   T (tired) Are you often tired, fatigued, or sleepy during the day?   YES  O (obstruction) Do you stop breathing, choke, or gasp during sleep? NO   P (pressure) Do you have or are you being treated for high blood pressure? YES   B (BMI) Is your body index greater than 35 kg/m? NO   A (age) Are you 74 years old or older? YES   N (neck) Do you have a neck circumference greater than 16 inches?   YES   G (gender) Are you a male? YES   TOTAL STOP/BANG YES ANSWERS 5       A STOP-Bang score of 2 or less is considered low risk, and a score of 5 or more is high risk for having either moderate or severe OSA. For people who score 3 or 4, doctors may need to perform further assessment to determine how likely they are to have OSA.         EPWORTH SLEEPINESS SCALE:  Scale:  (0)= no chance of dozing; (1)= slight chance of dozing; (2)= moderate chance of dozing; (3)= high chance of dozing  Chance  Situtation    Sitting and reading: 1    Watching TV: 1    Sitting Inactive in public: 1    As a passenger in car: 0      Lying down to rest: 1    Sitting and talking: 0    Sitting quielty after lunch: 1    In a car, stopped in traffic: 0   TOTAL SCORE:   5 out of 24    SLEEP STUDIES:  PSG (11/2015) AHI 65/hr, min SPO2 58% Titration (11/2015) recommend another titration Titration (11/2015) BiPAP ASV@ EPAP min 10 max 15, PS min 6 max 15, MaxPress 25 cmH2O   CPAP COMPLIANCE DATA:  Date Range: 04/08/2023-04/06/2024  Average Daily Use: 6 hours 48 minutes  Median Use: 6 hours 39 minutes  Compliance for > 4 Hours: 94%  AHI: 4.2 respiratory events per hour  Days Used: 343/365 days  Mask Leak: 43.6  95th Percentile Pressure: 13.7/8.2         LABS: No results found for this or any previous visit (from the past 2160 hours).  Radiology: DG Shoulder Left Result Date: 04/23/2009 * PRIOR REPORT  IMPORTED FROM AN EXTERNAL SYSTEM * PRIOR REPORT IMPORTED FROM THE [facility] WORKFLOW SYSTEM REASON FOR EXAM:    fall, shoulder pain COMMENTS: PROCEDURE:     DXR - DXR SHOULDER LEFT COMPLETE  - Apr 23 2009 12:40PM RESULT:     Three views of the left shoulder reveal the bones to be reasonably well mineralized. I do not see evidence of acute fracture nor dislocation. The [facility] joint is grossly intact. IMPRESSION:  I see no acute bony abnormality of the left shoulder.    CT CERVICAL SPINE W CONTRAST  ([facility] HX) Result Date: 04/23/2009 * PRIOR REPORT IMPORTED FROM AN EXTERNAL SYSTEM * PRIOR REPORT IMPORTED FROM THE [facility] WORKFLOW SYSTEM REASON FOR EXAM:    fall, upper neck pain COMMENTS: PROCEDURE:     CT  - CT CERVICAL SPINE WO  - Apr 23 2009 12:05PM RESULT:     Multislice helical acquisition through the cervical spine is reconstructed at bone window settings in the axial, coronal and sagittal planes. There is no previous exam for comparison. The craniocervical and atlantoaxial alignment appear to be maintained. The odontoid is intact. The prevertebral soft tissues appear normal. Degenerative disc space narrowing and hypertrophic spurring is present at C5-C6 and C6-C7 and to a lesser extent C4-C5. There is no subluxation. IMPRESSION:      Degenerative changes. No acute bony abnormality demonstrated.    CT MAXILLOFACIAL WO CONTRAST ([facility] HX) Result Date: 04/23/2009 * PRIOR REPORT IMPORTED FROM AN EXTERNAL SYSTEM * PRIOR REPORT IMPORTED FROM THE [facility] WORKFLOW SYSTEM REASON FOR EXAM:    fall, left sided facial pain COMMENTS: PROCEDURE:     CT  - CT MAXILLOFACIAL AREA WO  - Apr 23 2009 12:04PM RESULT:     Multislice helical acquisition through the maxillofacial structures is reconstructed in the axial and coronal planes at 3 mm slice thickness at bone window settings. There is no previous exam for comparison. The nasal septum shows a curvature to the left without a definite fracture. A nondisplaced left nasal bone  fracture cannot be completely excluded but this could represent a prominent suture. The mandible appears intact. The condyles are located in the temporomandibular fossa. The zygomatic arches, pterygoid plates and visualized portions of the temporal bone appear intact. The orbits showed no fracture of the wall. The sinuses show grossly normal aeration. IMPRESSION: 1. The possibility of a nondisplaced left nasal bone fractures not excluded. Otherwise, the study is unremarkable.    CT HEAD LIMITED W/O CM Result Date: 04/23/2009 * PRIOR REPORT IMPORTED FROM AN EXTERNAL SYSTEM * PRIOR REPORT IMPORTED FROM THE [employer] WORKFLOW SYSTEM REASON FOR EXAM:    fall, head injury, +LOC COMMENTS: PROCEDURE:     CT  - CT HEAD WITHOUT CONTRAST  - Apr 23 2009 12:04PM RESULT:     Noncontrast emergent CT of the brain is performed. The patient has no previous examination for comparison. The ventricles and sulci are normal. There is no hemorrhage. There is no focal mass, mass-effect or midline shift. There is no evidence of edema or territorial infarct. The bone windows demonstrate normal aeration of the paranasal sinuses and mastoid air cells. There is no skull fracture demonstrated. IMPRESSION: 1. No acute intracranial abnormality.     No results found.  No results found.    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 Complex sleep apnea syndrome (Primary) The patient does tolerate PAP and reports  benefit from PAP use. His AHI for the last year is 4.2, but in the last 3 months or so his numbers are higher. He has  had high mask leak, and admits he needs a new mask badly as his is leaking tremendously. We will do a download in 4 weeks after he has replaced the mask. If the apneas continue to be above target he is agreeable to a repeat titration. The patient was reminded how to clean equipment and advised to replace supplies routinely. The patient was also counselled on weight loss. The compliance is excellent.   Complex apnea- replace mask, with follow up download in 4 weeks. If apneas still remain uncontrolled will have a titration done.   2. Encounter for BiPAP use counseling PAP Counseling: had a lengthy discussion with the patient regarding the importance of PAP therapy in management of the sleep apnea. Patient appears to understand the risk factor reduction and also understands the risks associated with untreated sleep apnea. Patient will try to make a good faith effort to remain compliant with therapy. Also instructed the patient on proper cleaning of the device including the water must be changed daily if possible and use of distilled water is preferred. Patient understands that the machine should be regularly cleaned with appropriate recommended cleaning solutions that do not damage the PAP machine for example given white vinegar and water rinses. Other methods such as ozone treatment may not be as good as these simple methods to achieve cleaning.   3. Essential hypertension Controlled with enalapril. Continue.     General Counseling: I have discussed the findings of the evaluation and examination with Garnette.  I have also discussed any further diagnostic evaluation thatmay be needed or ordered today. Nichola verbalizes understanding of the findings of todays visit. We also reviewed his medications today and discussed drug interactions and side effects including but not limited excessive drowsiness and altered mental states. We also discussed that there is always a risk not just to him but also people  around him. he has been encouraged to call the office with any questions or concerns that should arise related to todays visit.  No orders of the defined types were placed in this encounter.       I have personally obtained a history, examined the patient, evaluated laboratory and imaging results, formulated the assessment and plan and placed orders. This patient was seen today by Lauraine Lay, PA-C in collaboration with Dr. Elfreda Bathe.   Elfreda DELENA Bathe, MD [facility] Diplomate ABMS Pulmonary Critical Care Medicine and Sleep Medicine

## 2024-04-11 ENCOUNTER — Ambulatory Visit: Admitting: Internal Medicine

## 2024-04-11 VITALS — BP 151/84 | HR 66 | Resp 16 | Ht 66.0 in | Wt 186.0 lb

## 2024-04-11 DIAGNOSIS — I10 Essential (primary) hypertension: Secondary | ICD-10-CM

## 2024-04-11 DIAGNOSIS — G4739 Other sleep apnea: Secondary | ICD-10-CM

## 2024-04-11 DIAGNOSIS — Z7189 Other specified counseling: Secondary | ICD-10-CM

## 2024-04-11 NOTE — Patient Instructions (Signed)
